# Patient Record
Sex: Male | Born: 1955 | Race: White | Hispanic: No | Marital: Single | Smoking: Former smoker
Health system: Southern US, Community
[De-identification: ages and names within clinical notes are randomized; demographics above are authoritative.]

## PROBLEM LIST (undated history)

## (undated) DIAGNOSIS — I10 Essential (primary) hypertension: Secondary | ICD-10-CM

## (undated) DIAGNOSIS — M549 Dorsalgia, unspecified: Secondary | ICD-10-CM

---

## 2019-06-20 ENCOUNTER — Encounter (HOSPITAL_COMMUNITY): Payer: Self-pay | Admitting: Emergency Medicine

## 2019-06-20 ENCOUNTER — Other Ambulatory Visit: Payer: Self-pay

## 2019-06-20 ENCOUNTER — Emergency Department (HOSPITAL_COMMUNITY)
Admission: EM | Admit: 2019-06-20 | Discharge: 2019-06-20 | Disposition: A | Discharge status: Home / Self Care | Payer: Medicare HMO | Attending: Emergency Medicine | Admitting: Emergency Medicine

## 2019-06-20 ENCOUNTER — Emergency Department (HOSPITAL_COMMUNITY): Payer: Medicare HMO

## 2019-06-20 DIAGNOSIS — J069 Acute upper respiratory infection, unspecified: Secondary | ICD-10-CM

## 2019-06-20 DIAGNOSIS — R0789 Other chest pain: Secondary | ICD-10-CM | POA: Insufficient documentation

## 2019-06-20 DIAGNOSIS — J029 Acute pharyngitis, unspecified: Secondary | ICD-10-CM | POA: Diagnosis not present

## 2019-06-20 DIAGNOSIS — M7918 Myalgia, other site: Secondary | ICD-10-CM | POA: Insufficient documentation

## 2019-06-20 DIAGNOSIS — Z20822 Contact with and (suspected) exposure to covid-19: Secondary | ICD-10-CM | POA: Diagnosis not present

## 2019-06-20 DIAGNOSIS — I10 Essential (primary) hypertension: Secondary | ICD-10-CM | POA: Insufficient documentation

## 2019-06-20 DIAGNOSIS — R05 Cough: Secondary | ICD-10-CM | POA: Diagnosis present

## 2019-06-20 HISTORY — DX: Dorsalgia, unspecified: M54.9

## 2019-06-20 HISTORY — DX: Essential (primary) hypertension: I10

## 2019-06-20 LAB — URINALYSIS, ROUTINE W REFLEX MICROSCOPIC
Bilirubin Urine: NEGATIVE
Glucose, UA: NEGATIVE mg/dL
Hgb urine dipstick: NEGATIVE
Ketones, ur: NEGATIVE mg/dL
Leukocytes,Ua: NEGATIVE
Nitrite: NEGATIVE
Protein, ur: NEGATIVE mg/dL
Specific Gravity, Urine: 1.024 (ref 1.005–1.030)
pH: 6 (ref 5.0–8.0)

## 2019-06-20 LAB — SARS CORONAVIRUS 2 (TAT 6-24 HRS): SARS Coronavirus 2: NEGATIVE

## 2019-06-20 LAB — CBC
HCT: 42.4 % (ref 39.0–52.0)
Hemoglobin: 13.6 g/dL (ref 13.0–17.0)
MCH: 30.3 pg (ref 26.0–34.0)
MCHC: 32.1 g/dL (ref 30.0–36.0)
MCV: 94.4 fL (ref 80.0–100.0)
Platelets: 257 10*3/uL (ref 150–400)
RBC: 4.49 MIL/uL (ref 4.22–5.81)
RDW: 13.2 % (ref 11.5–15.5)
WBC: 8.7 10*3/uL (ref 4.0–10.5)
nRBC: 0 % (ref 0.0–0.2)

## 2019-06-20 LAB — BASIC METABOLIC PANEL
Anion gap: 12 (ref 5–15)
BUN: 11 mg/dL (ref 8–23)
CO2: 26 mmol/L (ref 22–32)
Calcium: 9.4 mg/dL (ref 8.9–10.3)
Chloride: 97 mmol/L — ABNORMAL LOW (ref 98–111)
Creatinine, Ser: 0.8 mg/dL (ref 0.61–1.24)
GFR calc Af Amer: >60 mL/min (ref >60)
GFR calc non Af Amer: >60 mL/min (ref >60)
Glucose, Bld: 121 mg/dL — ABNORMAL HIGH (ref 70–99)
Potassium: 3.4 mmol/L — ABNORMAL LOW (ref 3.5–5.1)
Sodium: 135 mmol/L (ref 135–145)

## 2019-06-20 LAB — POC SARS CORONAVIRUS 2 AG -  ED: SARS Coronavirus 2 Ag: NEGATIVE

## 2019-06-20 LAB — GROUP A STREP BY PCR: Group A Strep by PCR: NOT DETECTED

## 2019-06-20 MED ORDER — SODIUM CHLORIDE 0.9% FLUSH: 3.0000 mL | Freq: Once | INTRAVENOUS | Status: DC

## 2019-06-20 MED ORDER — BENZONATATE 100 MG PO CAPS: 100.0000 mg | ORAL_CAPSULE | Freq: Three times a day (TID) | ORAL | 0 refills | Status: AC | PRN

## 2019-06-20 MED ORDER — HYDROCODONE-ACETAMINOPHEN 7.5-325 MG/15ML PO SOLN
10.0000 mL | Freq: Once | ORAL | Status: DC
  Filled 2019-06-20 (×2): qty 15

## 2019-06-20 MED ORDER — OXYCODONE-ACETAMINOPHEN 5-325 MG PO TABS
1.0000 | ORAL_TABLET | Freq: Once | ORAL | Status: AC
  Administered 2019-06-20: 1 via ORAL
  Filled 2019-06-20: qty 1

## 2019-06-20 MED ORDER — HYDROCODONE-ACETAMINOPHEN 7.5-325 MG/15ML PO SOLN
15.0000 mL | Freq: Once | ORAL | Status: AC
  Administered 2019-06-20: 15 mL via ORAL
  Filled 2019-06-20: qty 15

## 2019-06-20 MED ORDER — BENZONATATE 100 MG PO CAPS
200.0000 mg | ORAL_CAPSULE | Freq: Once | ORAL | Status: AC
  Administered 2019-06-20: 200 mg via ORAL
  Filled 2019-06-20: qty 2

## 2019-06-20 NOTE — Discharge Instructions (Signed)
Please take the [facility], as needed for cough.  Continue with your prescribed pain medications, as needed.  Your history and physical exam is suggestive of an upper respiratory infection, likely viral etiology.  Your laboratory work-up here is unremarkable.  Chest x-ray is clear and without any concerning findings.  Please consider over-the-counter NSAIDs such as ibuprofen for your sore throat symptoms.  You may also consider throat lozenges, Chloraseptic spray, or warm tea with honey.  You may consider your other previously prescribed medications, as directed.  Make sure that you are taking your pills with a full glass of water.  Return to the ED or seek immediate medical attention should experience any new or worsening symptoms.

## 2019-06-20 NOTE — ED Provider Notes (Signed)
  Face-to-face evaluation   History: Patient presents for evaluation of general achiness, sore throat, and abdominal pain.  He has been coughing and having congestion.  No known fever.  He is traveling, visiting from the [place].  He has ulcers of the lower legs which have been treated with Unna boots with improvement by his report.  Physical exam: Obese, alert and cooperative.  No respiratory distress.  Oropharynx, is moist.  Unable to see tonsils because of poor view.  There is no trismus.  Abdomen is distended but soft and nontender.  Medical screening examination/treatment/procedure(s) were conducted as a shared visit with non-physician practitioner(s) and myself.  I personally evaluated the patient during the encounter    Mancel Bale, MD 06/20/19 2112

## 2019-06-20 NOTE — ED Provider Notes (Signed)
[facility] EMERGENCY DEPARTMENT Provider Note   CSN: [id number] Arrival date & time: 06/20/19  0848     History Chief Complaint  Patient presents with  . Sore Throat  . Generalized Body Aches    Bryan Middleton is a 64 y.o. male with no relevant past medical history who presents to the ED with 2-day history of sore throat, nasal congestion and rhinorrhea, nonproductive cough, and generalized body aches.  Patient reports that he was at a wedding this weekend, but denies any obvious COVID-19 contacts.  He has not yet been vaccinated for COVID-19.  He is accompanied by his friend who is helping with communication given patient's intellectual disability.  They are concerned about a possible COVID-19 infection as they have been to [facility] and several other locations during their trip.  They are headed home now, however wanted to stop for evaluation.  Patient typically takes his blood pressure medication as well as a chronic pain medication for his low back discomfort.  He denies any numbness or weakness, incontinence, or other focal neurologic deficits.  He also denies any fevers or chills, chest pain, abdominal pain, nausea or vomiting, or other symptoms.  He does have some body aches when coughing.  HPI     Past Medical History:  Diagnosis Date  . Back pain   . Hypertension     There are no problems to display for this patient.   History reviewed. No pertinent surgical history.     No family history on file.  Social History   Tobacco Use  . Smoking status: Former Games developer  . Smokeless tobacco: Never Used  Substance Use Topics  . Alcohol use: Not Currently  . Drug use: Not Currently    Home Medications Prior to Admission medications   Medication Sig Start Date End Date Taking? Authorizing Provider  benzonatate (TESSALON) 100 MG capsule Take 1 capsule (100 mg total) by mouth every 8 (eight) hours as needed for cough. 06/20/19   Lorelee New, PA-C     Allergies    Patient has no allergy information on record.  Review of Systems   Review of Systems  All other systems reviewed and are negative.   Physical Exam Updated Vital Signs BP (!) 144/97   Pulse 76   Temp 98.2 F (36.8 C) (Oral)   Resp 20   Ht 5\' 4"  (1.626 m)   Wt 86.2 kg   SpO2 96%   BMI 32.61 kg/m   Physical Exam Vitals and nursing note reviewed. Exam conducted with a chaperone present.  Constitutional:      Appearance: Normal appearance.  HENT:     Head: Normocephalic and atraumatic.     Mouth/Throat:     Comments: Patient has a small mouth, however no trismus.  Oropharynx is difficult to visualize.  Hard palate rises symmetrically.  No floor of mouth induration.  Tolerating secretions well. Eyes:     General: No scleral icterus.    Conjunctiva/sclera: Conjunctivae normal.  Cardiovascular:     Rate and Rhythm: Normal rate and regular rhythm.     Pulses: Normal pulses.     Heart sounds: Normal heart sounds.  Pulmonary:     Effort: Pulmonary effort is normal. No respiratory distress.     Breath sounds: Normal breath sounds. No wheezing or rales.  Abdominal:     General: Abdomen is flat. There is no distension.     Palpations: Abdomen is soft.     Tenderness: There is  no abdominal tenderness. There is no guarding.  Musculoskeletal:     Cervical back: Normal range of motion and neck supple. No rigidity.     Comments: Legs are wrapped bilaterally.  (Chronic).  Distal sensation intact and able to ambulate.  Strength intact throughout.  Can move all extremities.  Skin:    General: Skin is dry.     Capillary Refill: Capillary refill takes less than 2 seconds.  Neurological:     General: No focal deficit present.     Mental Status: He is alert and oriented to person, place, and time.     GCS: GCS eye subscore is 4. GCS verbal subscore is 5. GCS motor subscore is 6.  Psychiatric:        Mood and Affect: Mood normal.        Behavior: Behavior normal.         Thought Content: Thought content normal.     ED Results / Procedures / Treatments   Labs (all labs ordered are listed, but only abnormal results are displayed) Labs Reviewed  BASIC METABOLIC PANEL - Abnormal; Notable for the following components:      Result Value   Potassium 3.4 (*)    Chloride 97 (*)    Glucose, Bld 121 (*)    All other components within normal limits  GROUP A STREP BY PCR  SARS CORONAVIRUS 2 (TAT 6-24 HRS)  CBC  URINALYSIS, ROUTINE W REFLEX MICROSCOPIC  POC SARS CORONAVIRUS 2 AG -  ED    EKG None  Radiology DG Chest Portable 1 View  Result Date: 06/20/2019 CLINICAL DATA:  Cough and chest pain EXAM: PORTABLE CHEST 1 VIEW COMPARISON:  None. FINDINGS: Lungs are clear. Heart size and pulmonary vascularity are normal. No adenopathy. There are apparent calcified right hilar lymph nodes. No lymph node enlargement appreciable. There is degenerative change in the thoracic spine. IMPRESSION: Apparent calcified right hilar lymph nodes, likely indicative of prior granulomatous disease. No adenopathy evident. Lungs clear. Cardiac silhouette within normal limits. Electronically Signed   By: Lowella Grip III M.D.   On: 06/20/2019 13:54    Procedures Procedures (including critical care time)  Medications Ordered in ED Medications  sodium chloride flush (NS) 0.9 % injection 3 mL (3 mLs Intravenous Not Given 06/20/19 1104)  benzonatate (TESSALON) capsule 200 mg (has no administration in time range)  HYDROcodone-acetaminophen (HYCET) 7.5-325 mg/15 ml solution 15 mL (15 mLs Oral Given 06/20/19 1248)  oxyCODONE-acetaminophen (PERCOCET/ROXICET) 5-325 MG per tablet 1 tablet (1 tablet Oral Given 06/20/19 1438)    ED Course  I have reviewed the triage vital signs and the nursing notes.  Pertinent labs & imaging results that were available during my care of the patient were reviewed by me and considered in my medical decision making (see chart for details).    MDM  Rules/Calculators/A&P                          Patient's history and physical exam is consistent with an upper respiratory infection, likely viral.  Obtained rapid strep testing which was negative here in the ED.  POC COVID-19 testing was negative, however will obtain PCR testing.  Patient takes chronic pain medication and had not taken any prior to his ED encounter here today.  He is complaining of right-sided anterolateral chest wall discomfort that he states he developed after coughing.  He states that it is worse with coughing.  Suspect musculoskeletal etiology.  I personally reviewed plain films obtained of chest which were negative for any osseous abnormalities, pneumonia, pneumothorax, or other acute cardiopulmonary findings.  His physical exam is notable for focal tenderness over area of ribs.  Negative Murphy's sign and no significant abdominal tenderness to palpation.  His vital signs are stable with WNL.  No leukocytosis concerning for large infection.  Low suspicion for epiglottitis, RPA, or other large soft tissue infection involving throat.  BMP without significant derangement.  Strict ED return precautions discussed with patient and his friend.  They plan to continue driving back to [place] and will follow up with his primary care provider at earliest convenience.  All of the evaluation and work-up results were discussed with the patient and any family at bedside. They were provided opportunity to ask any additional questions and have none at this time. They have expressed understanding of verbal discharge instructions as well as return precautions and are agreeable to the plan.   Edwin Baines was evaluated in Emergency Department on 06/20/2019 for the symptoms described in the history of present illness. He was evaluated in the context of the global COVID-19 pandemic, which necessitated consideration that the patient might be at risk for infection with the SARS-CoV-2 virus that causes  COVID-19. Institutional protocols and algorithms that pertain to the evaluation of patients at risk for COVID-19 are in a state of rapid change based on information released by regulatory bodies including the CDC and federal and state organizations. These policies and algorithms were followed during the patient's care in the ED.   Final Clinical Impression(s) / ED Diagnoses Final diagnoses:  Viral upper respiratory tract infection    Rx / DC Orders ED Discharge Orders         Ordered    benzonatate (TESSALON) 100 MG capsule  Every 8 hours PRN     Discontinue  Reprint     06/20/19 1444           Elvera Maria 06/20/19 1444    Mancel Bale, MD 06/20/19 2112

## 2019-06-20 NOTE — ED Triage Notes (Addendum)
Pt states he is visiting from [place] for a wedding.  Reports body aches, generalized weakness, sore throat, SOB, and nasal congestion since yesterday.  No known COVID contacts.  Pt has not received COVID vaccine.

## 2019-10-08 DEATH — deceased

## 2021-07-13 IMAGING — DX DG CHEST 1V PORT
1 series · 1 of 1 positions shown · non-contrast
Comparison: None.

CLINICAL DATA: Cough and chest pain

EXAM:
PORTABLE CHEST 1 VIEW

[chest]
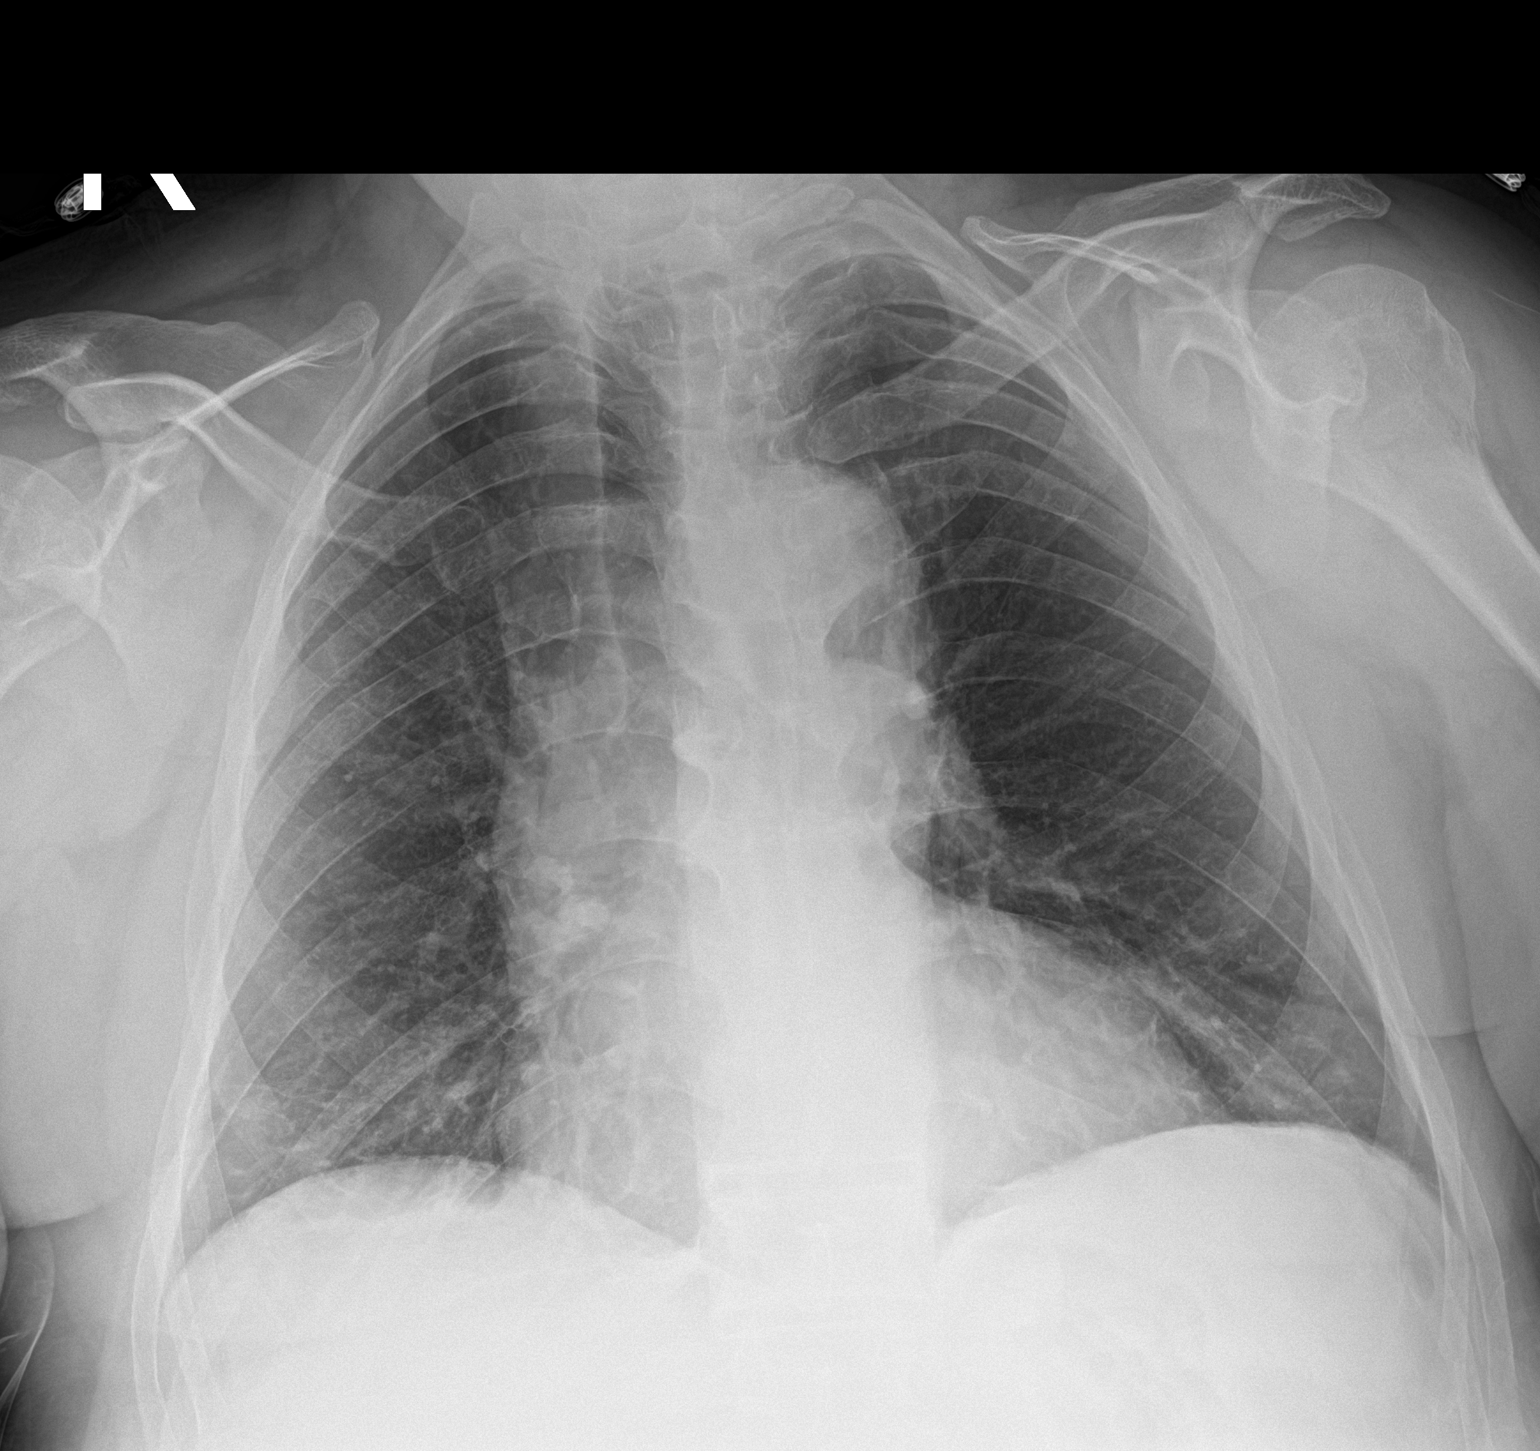

[1 of 1 positions shown; findings below may reference images not displayed]

FINDINGS: Lungs are clear. Heart size and pulmonary vascularity are normal. No
adenopathy. There are apparent calcified right hilar lymph nodes. No
lymph node enlargement appreciable. There is degenerative change in
the thoracic spine.
IMPRESSION: Apparent calcified right hilar lymph nodes, likely indicative of
prior granulomatous disease. No adenopathy evident. Lungs clear.
Cardiac silhouette within normal limits.
